# Patient Record
Sex: Male | Born: 1985 | Race: White | Hispanic: No | Marital: Married | Smoking: Never smoker
Health system: Southern US, Community
[De-identification: ages and names within clinical notes are randomized; demographics above are authoritative.]

## PROBLEM LIST (undated history)

## (undated) DIAGNOSIS — G43909 Migraine, unspecified, not intractable, without status migrainosus: Secondary | ICD-10-CM

## (undated) HISTORY — DX: Migraine, unspecified, not intractable, without status migrainosus: G43.909

---

## 2014-01-12 ENCOUNTER — Other Ambulatory Visit: Payer: Self-pay | Admitting: *Deleted

## 2014-01-12 ENCOUNTER — Ambulatory Visit: Payer: No Typology Code available for payment source

## 2014-01-12 ENCOUNTER — Ambulatory Visit
Admission: RE | Admit: 2014-01-12 | Discharge: 2014-01-12 | Disposition: A | Discharge status: Home / Self Care | Payer: No Typology Code available for payment source | Source: Ambulatory Visit | Attending: Emergency Medicine | Admitting: Emergency Medicine

## 2014-01-12 ENCOUNTER — Ambulatory Visit (INDEPENDENT_AMBULATORY_CARE_PROVIDER_SITE_OTHER): Payer: No Typology Code available for payment source | Admitting: Emergency Medicine

## 2014-01-12 VITALS — BP 119/77 | HR 72 | Temp 97.9°F | Resp 18 | Ht 74.0 in | Wt 179.4 lb

## 2014-01-12 DIAGNOSIS — R1032 Left lower quadrant pain: Secondary | ICD-10-CM

## 2014-01-12 DIAGNOSIS — N1330 Unspecified hydronephrosis: Secondary | ICD-10-CM

## 2014-01-12 DIAGNOSIS — R112 Nausea with vomiting, unspecified: Secondary | ICD-10-CM

## 2014-01-12 DIAGNOSIS — R634 Abnormal weight loss: Secondary | ICD-10-CM

## 2014-01-12 LAB — COMPREHENSIVE METABOLIC PANEL
ALT: 23 U/L (ref 0–53)
AST: 13 U/L (ref 0–37)
Albumin: 4.7 g/dL (ref 3.5–5.2)
Alkaline Phosphatase: 48 U/L (ref 39–117)
BUN: 13 mg/dL (ref 6–23)
CO2: 29 meq/L (ref 19–32)
CREATININE: 0.88 mg/dL (ref 0.50–1.35)
Calcium: 9.7 mg/dL (ref 8.4–10.5)
Chloride: 104 mEq/L (ref 96–112)
Glucose, Bld: 90 mg/dL (ref 70–99)
Potassium: 4.3 mEq/L (ref 3.5–5.3)
Sodium: 141 mEq/L (ref 135–145)
Total Bilirubin: 1 mg/dL (ref 0.2–1.2)
Total Protein: 7.5 g/dL (ref 6.0–8.3)

## 2014-01-12 LAB — POCT CBC
Granulocyte percent: 60.6 %G (ref 37–80)
HEMATOCRIT: 48.9 % (ref 43.5–53.7)
Hemoglobin: 15.5 g/dL (ref 14.1–18.1)
LYMPH, POC: 1.6 (ref 0.6–3.4)
MCH, POC: 29.1 pg (ref 27–31.2)
MCHC: 31.7 g/dL — AB (ref 31.8–35.4)
MCV: 91.7 fL (ref 80–97)
MID (cbc): 0.4 (ref 0–0.9)
MPV: 8.3 fL (ref 0–99.8)
POC GRANULOCYTE: 3.2 (ref 2–6.9)
POC LYMPH %: 31.1 % (ref 10–50)
POC MID %: 8.3 %M (ref 0–12)
Platelet Count, POC: 204 10*3/uL (ref 142–424)
RBC: 5.33 M/uL (ref 4.69–6.13)
RDW, POC: 13.7 %
WBC: 5.3 10*3/uL (ref 4.6–10.2)

## 2014-01-12 LAB — T4, FREE: FREE T4: 1.32 ng/dL (ref 0.80–1.80)

## 2014-01-12 LAB — TSH: TSH: 1.272 u[IU]/mL (ref 0.350–4.500)

## 2014-01-12 MED ORDER — ONDANSETRON 4 MG PO TBDP
4.0000 mg | ORAL_TABLET | Freq: Once | ORAL | Status: AC
  Administered 2014-01-12: 4 mg via ORAL

## 2014-01-12 MED ORDER — ONDANSETRON 8 MG PO TBDP: 8.0000 mg | ORAL_TABLET | Freq: Three times a day (TID) | ORAL | Status: DC | PRN

## 2014-01-12 MED ORDER — IOHEXOL 300 MG/ML  SOLN
100.0000 mL | Freq: Once | INTRAMUSCULAR | Status: AC | PRN
  Administered 2014-01-12: 100 mL via INTRAVENOUS

## 2014-01-12 NOTE — Progress Notes (Signed)
   Subjective:    Patient ID: Randy Gibbs, male    DOB: June 03, 1986, 28 y.o.   MRN: [id number]  HPI patient states over the past few months he has had significant weight loss. He thought this was secondary to [facility] but now he's not sure. He became ill on Sunday and an episode of vomiting. Off-and-on since then he has had some loose stools. He has been having discomfort in his left lower abdomen. He states initially he had some fever but this is now resolved. He denies vomiting up any blood or having any blood or mucus in the loose stools. He has not been on antibiotics recently he has had no recent foreign travel his wife is also ill and initially.    Review of Systems     Objective:   Physical Exam patient is alert and cooperative he is not icteric. His neck is supple. His chest was clear to both auscultation and percussion. His heart is regular rate without murmurs rubs or gallops. His abdomen is flat there are bowel sounds present. He has significant discomfort in the left lower quadrant without rebound. Extremities without cyanosis clubbing or edema. Results for orders placed in visit on 01/12/14  POCT CBC      Result Value Ref Range   WBC 5.3  4.6 - 10.2 K/uL   Lymph, poc 1.6  0.6 - 3.4   POC LYMPH PERCENT 31.1  10 - 50 %L   MID (cbc) 0.4  0 - 0.9   POC MID % 8.3  0 - 12 %M   POC Granulocyte 3.2  2 - 6.9   Granulocyte percent 60.6  37 - 80 %G   RBC 5.33  4.69 - 6.13 M/uL   Hemoglobin 15.5  14.1 - 18.1 g/dL   HCT, POC 40.948.9  81.143.5 - 53.7 %   MCV 91.7  80 - 97 fL   MCH, POC 29.1  27 - 31.2 pg   MCHC 31.7 (*) 31.8 - 35.4 g/dL   RDW, POC 91.413.7     Platelet Count, POC 204  142 - 424 K/uL   MPV 8.3  0 - 99.8 fL   [facility] reading (PRIMARY) by  Dr. Cleta Albertsaub there is stool  present in the right side of the colon. There is no free air, no obstruction.         Assessment & Plan:  Patient presents with a history of fever vomiting and diarrhea and significant left lower  quadrant abdominal discomfort on exam. This could be a colitis versus diverticulitis. We'll screen with a CBC, and  acute abdominal series. White count would say that this is a viral illness. We'll give him some Zofran and Gatorade and proceed with outpatient CT.

## 2014-01-12 NOTE — Patient Instructions (Signed)

## 2014-01-14 ENCOUNTER — Other Ambulatory Visit: Payer: Self-pay | Admitting: Radiology

## 2014-01-14 DIAGNOSIS — N1330 Unspecified hydronephrosis: Secondary | ICD-10-CM

## 2014-01-20 ENCOUNTER — Other Ambulatory Visit (HOSPITAL_COMMUNITY): Payer: Self-pay | Admitting: Urology

## 2014-01-20 DIAGNOSIS — N1330 Unspecified hydronephrosis: Secondary | ICD-10-CM

## 2014-01-28 ENCOUNTER — Ambulatory Visit (HOSPITAL_COMMUNITY): Admission: RE | Admit: 2014-01-28 | Payer: No Typology Code available for payment source | Source: Ambulatory Visit

## 2014-03-28 ENCOUNTER — Ambulatory Visit (INDEPENDENT_AMBULATORY_CARE_PROVIDER_SITE_OTHER): Payer: No Typology Code available for payment source | Admitting: Family Medicine

## 2014-03-28 VITALS — BP 102/74 | HR 91 | Temp 98.4°F | Resp 18 | Ht 74.5 in | Wt 174.0 lb

## 2014-03-28 DIAGNOSIS — G43909 Migraine, unspecified, not intractable, without status migrainosus: Secondary | ICD-10-CM

## 2014-03-28 DIAGNOSIS — R110 Nausea: Secondary | ICD-10-CM

## 2014-03-28 MED ORDER — ONDANSETRON 8 MG PO TBDP: 8.0000 mg | ORAL_TABLET | Freq: Three times a day (TID) | ORAL | Status: DC | PRN

## 2014-03-28 NOTE — Progress Notes (Signed)
 Chief Complaint:  Chief Complaint  Patient presents with  . Migraine    woke with this morning unable to work needs note     HPI: Randy GardenerJames Broner is a 28 y.o. male who is here for  Note for work since ut with migrine. Typical migraine, is getting better. Takes imitrex, 9 pills of 100 mg of imitrex breaks it in half and lasts for 1 month, he is getting better on imitrex.  He does not need anything extra. He has had some nause, it is not he worse HA of his life.  Has tried several different things in the past No known triggers, sees HA specialist, no one in family has HA/migraines.  He has tried botox, other triptanbs, topimax  Past Medical History  Diagnosis Date  . Migraines    History reviewed. No pertinent past surgical history. History   Social History  . Marital Status: Married    Spouse Name: N/A    Number of Children: N/A  . Years of Education: N/A   Social History Main Topics  . Smoking status: Never Smoker   . Smokeless tobacco: None  . Alcohol Use: No  . Drug Use: No  . Sexual Activity: None   Other Topics Concern  . None   Social History Narrative  . None   History reviewed. No pertinent family history. No Known Allergies Prior to Admission medications   Medication Sig Start Date End Date Taking? Authorizing Provider  SUMAtriptan (IMITREX) 100 MG tablet Take 100 mg by mouth every 2 (two) hours as needed for migraine or headache. May repeat in 2 hours if headache persists or recurs.   Yes Historical Provider, MD  ondansetron (ZOFRAN-ODT) 8 MG disintegrating tablet Take 1 tablet (8 mg total) by mouth every 8 (eight) hours as needed for nausea. 01/12/14   Collene GobbleSteven A Daub, MD     ROS: The patient denies fevers, chills, night sweats, unintentional weight loss, chest pain, palpitations, wheezing, dyspnea on exertion,  vomiting, abdominal pain, dysuria, hematuria, melena, numbness, weakness, or tingling. NO vision changes, eye pain.   All other systems have  been reviewed and were otherwise negative with the exception of those mentioned in the HPI and as above.    PHYSICAL EXAM: Filed Vitals:   03/28/14 1353  BP: 102/74  Pulse: 91  Temp: 98.4 F (36.9 C)  Resp: 18   Filed Vitals:   03/28/14 1353  Height: 6' 2.5" (1.892 m)  Weight: 174 lb (78.926 kg)   Body mass index is 22.05 kg/(m^2).  General: Alert, no acute distress HEENT:  Normocephalic, atraumatic, oropharynx patent. EOMI, PERRLA Cardiovascular:  Regular rate and rhythm, no rubs murmurs or gallops.  No Carotid bruits, radial pulse intact. No pedal edema.  Respiratory: Clear to auscultation bilaterally.  No wheezes, rales, or rhonchi.  No cyanosis, no use of accessory musculature GI: No organomegaly, abdomen is soft and non-tender, positive bowel sounds.  No masses. Skin: No rashes. Neurologic: Facial musculature symmetric. Psychiatric: Patient is appropriate throughout our interaction. Lymphatic: No cervical lymphadenopathy Musculoskeletal: Gait intact.   LABS: Results for orders placed in visit on 01/12/14  TSH      Result Value Ref Range   TSH 1.272  0.350 - 4.500 uIU/mL  COMPREHENSIVE METABOLIC PANEL      Result Value Ref Range   Sodium 141  135 - 145 mEq/L   Potassium 4.3  3.5 - 5.3 mEq/L   Chloride 104  96 - 112 mEq/L  CO2 29  19 - 32 mEq/L   Glucose, Bld 90  70 - 99 mg/dL   BUN 13  6 - 23 mg/dL   Creat 1.610.88  0.960.50 - 0.451.35 mg/dL   Total Bilirubin 1.0  0.2 - 1.2 mg/dL   Alkaline Phosphatase 48  39 - 117 U/L   AST 13  0 - 37 U/L   ALT 23  0 - 53 U/L   Total Protein 7.5  6.0 - 8.3 g/dL   Albumin 4.7  3.5 - 5.2 g/dL   Calcium 9.7  8.4 - 40.910.5 mg/dL  T4, FREE      Result Value Ref Range   Free T4 1.32  0.80 - 1.80 ng/dL  POCT CBC      Result Value Ref Range   WBC 5.3  4.6 - 10.2 K/uL   Lymph, poc 1.6  0.6 - 3.4   POC LYMPH PERCENT 31.1  10 - 50 %L   MID (cbc) 0.4  0 - 0.9   POC MID % 8.3  0 - 12 %M   POC Granulocyte 3.2  2 - 6.9   Granulocyte percent 60.6   37 - 80 %G   RBC 5.33  4.69 - 6.13 M/uL   Hemoglobin 15.5  14.1 - 18.1 g/dL   HCT, POC 81.148.9  91.443.5 - 53.7 %   MCV 91.7  80 - 97 fL   MCH, POC 29.1  27 - 31.2 pg   MCHC 31.7 (*) 31.8 - 35.4 g/dL   RDW, POC 78.213.7     Platelet Count, POC 204  142 - 424 K/uL   MPV 8.3  0 - 99.8 fL     EKG/XRAY:   [facility]   ASSESSMENT/PLAN: Encounter Diagnoses  Name Primary?  . Nausea alone Yes  . Migraine    Refileld zofran prn Work note given F/u prn  Gross sideeffects, risk and benefits, and alternatives of medications d/w patient. Patient is aware that all medications have potential sideeffects and we are unable to predict every sideeffect or drug-drug interaction that may occur.  nell Antuhao P , DO 03/28/2014 2:25 PM

## 2014-06-10 ENCOUNTER — Ambulatory Visit (INDEPENDENT_AMBULATORY_CARE_PROVIDER_SITE_OTHER): Payer: No Typology Code available for payment source | Admitting: Family Medicine

## 2014-06-10 ENCOUNTER — Encounter: Payer: Self-pay | Admitting: Family Medicine

## 2014-06-10 VITALS — BP 108/75 | HR 87 | Temp 98.3°F | Resp 16 | Ht 74.25 in | Wt 173.8 lb

## 2014-06-10 DIAGNOSIS — K625 Hemorrhage of anus and rectum: Secondary | ICD-10-CM

## 2014-06-10 DIAGNOSIS — Z0000 Encounter for general adult medical examination without abnormal findings: Secondary | ICD-10-CM

## 2014-06-10 DIAGNOSIS — K5900 Constipation, unspecified: Secondary | ICD-10-CM

## 2014-06-10 LAB — COMPLETE METABOLIC PANEL WITH GFR
AST: 12 U/L (ref 0–37)
Albumin: 4.8 g/dL (ref 3.5–5.2)
BUN: 17 mg/dL (ref 6–23)
CO2: 24 mEq/L (ref 19–32)
Calcium: 9.8 mg/dL (ref 8.4–10.5)
Chloride: 104 mEq/L (ref 96–112)
Creat: 0.87 mg/dL (ref 0.50–1.35)
GFR, Est African American: >89 mL/min
GFR, Est Non African American: >89 mL/min
Glucose, Bld: 90 mg/dL (ref 70–99)
Potassium: 4 mEq/L (ref 3.5–5.3)
Total Bilirubin: 0.9 mg/dL (ref 0.2–1.2)

## 2014-06-10 LAB — LIPID PANEL
Cholesterol: 122 mg/dL (ref 0–200)
HDL: 26 mg/dL — ABNORMAL LOW (ref >39)
LDL Cholesterol: 74 mg/dL (ref 0–99)
Total CHOL/HDL Ratio: 4.7 Ratio
Triglycerides: 112 mg/dL (ref <150)
VLDL: 22 mg/dL (ref 0–40)

## 2014-06-10 LAB — CBC WITH DIFFERENTIAL/PLATELET
Basophils Absolute: 0.1 10*3/uL (ref 0.0–0.1)
Basophils Relative: 1 % (ref 0–1)
Eosinophils Absolute: 0.8 10*3/uL — ABNORMAL HIGH (ref 0.0–0.7)
Eosinophils Relative: 11 % — ABNORMAL HIGH (ref 0–5)
HCT: 44.7 % (ref 39.0–52.0)
Hemoglobin: 16 g/dL (ref 13.0–17.0)
Lymphocytes Relative: 29 % (ref 12–46)
Lymphs Abs: 2.1 10*3/uL (ref 0.7–4.0)
MCH: 29 pg (ref 26.0–34.0)
MCHC: 35.8 g/dL (ref 30.0–36.0)
MCV: 81.1 fL (ref 78.0–100.0)
Monocytes Absolute: 0.7 10*3/uL (ref 0.1–1.0)
Monocytes Relative: 10 % (ref 3–12)
Neutro Abs: 3.6 10*3/uL (ref 1.7–7.7)
Neutrophils Relative %: 49 % (ref 43–77)
Platelets: 216 10*3/uL (ref 150–400)
RBC: 5.51 MIL/uL (ref 4.22–5.81)
RDW: 13.2 % (ref 11.5–15.5)
WBC: 7.3 10*3/uL (ref 4.0–10.5)

## 2014-06-10 LAB — COMPLETE METABOLIC PANEL WITHOUT GFR
ALT: 28 U/L (ref 0–53)
Alkaline Phosphatase: 52 U/L (ref 39–117)
Sodium: 139 meq/L (ref 135–145)
Total Protein: 7.6 g/dL (ref 6.0–8.3)

## 2014-06-10 LAB — TSH: TSH: 2.527 u[IU]/mL (ref 0.350–4.500)

## 2014-06-10 NOTE — Patient Instructions (Signed)

## 2014-06-10 NOTE — Progress Notes (Signed)
Chief Complaint:  Chief Complaint  Patient presents with  . Annual Exam  . Rectal Bleeding    noticing blood on toilet paper when he wipes.    HPI: Randy GardenerJames Bruschi is a 28 y.o. male who is here for CPE. HE has not complaints. He has had bright red blood on toilet paper only when he wipes after BM for last 1 week, has h/o straining, deneis hemorrhoids or rectal fissures or itching.  No family h/o colon issues, ie cancer UC/crohns, Denies diarrhea, abd , n/v. He did have weightloss with a prior job due to stress. No fevers, chills, night sweats. He did have some weightloss due to poor work environment but weight has stabilized since he quit his job.   Past Medical History  Diagnosis Date  . Migraines    History reviewed. No pertinent past surgical history. History   Social History  . Marital Status: Married    Spouse Name: N/A    Number of Children: N/A  . Years of Education: N/A   Social History Main Topics  . Smoking status: Never Smoker   . Smokeless tobacco: None  . Alcohol Use: No  . Drug Use: No  . Sexual Activity: Yes   Other Topics Concern  . None   Social History Narrative  . None   Family History  Problem Relation Age of Onset  . Cancer Mother   . Cancer Maternal Grandfather   . Heart disease Paternal Grandfather    No Known Allergies Prior to Admission medications   Medication Sig Start Date End Date Taking? Authorizing Provider  SUMAtriptan (IMITREX) 100 MG tablet Take 100 mg by mouth every 2 (two) hours as needed for migraine or headache. May repeat in 2 hours if headache persists or recurs.   Yes Historical Provider, MD  ondansetron (ZOFRAN-ODT) 8 MG disintegrating tablet Take 1 tablet (8 mg total) by mouth every 8 (eight) hours as needed for nausea. 03/28/14   Ennis Heavner P Manila Rommel, DO     ROS: The patient denies fevers, chills, night sweats, chest pain, palpitations, wheezing, dyspnea on exertion, nausea, vomiting, abdominal pain, dysuria, hematuria,  melena, numbness, weakness, or tingling.  All other systems have been reviewed and were otherwise negative with the exception of those mentioned in the HPI and as above.    PHYSICAL EXAM: Filed Vitals:   06/10/14 0832  BP: 108/75  Pulse: 87  Temp: 98.3 F (36.8 C)  Resp: 16   Filed Vitals:   06/10/14 0832  Height: 6' 2.25" (1.886 m)  Weight: 173 lb 12.8 oz (78.835 kg)   Body mass index is 22.16 kg/(m^2).  General: Alert, no acute distress HEENT:  Normocephalic, atraumatic, oropharynx patent. EOMI, PERRLA, fundo exam nl Cardiovascular:  Regular rate and rhythm, no rubs murmurs or gallops.  No Carotid bruits, radial pulse intact. No pedal edema.  Respiratory: Clear to auscultation bilaterally.  No wheezes, rales, or rhonchi.  No cyanosis, no use of accessory musculature GI: No organomegaly, abdomen is soft and non-tender, positive bowel sounds.  No masses. Skin: No rashes. Neurologic: Facial musculature symmetric. Psychiatric: Patient is appropriate throughout our interaction. Lymphatic: No cervical lymphadenopathy Musculoskeletal: Gait intact. 5/5 str, 2/2 DTRs Gu-circumcised male, no masses, lesions, neg hernia Neg hemorrhoids, +/- rectal fissure Rectal exam showed no masses, prostate is normal size  LABS: Results for orders placed in visit on 01/12/14  TSH      Result Value Ref Range   TSH 1.272  0.350 -  4.500 uIU/mL  COMPREHENSIVE METABOLIC PANEL      Result Value Ref Range   Sodium 141  135 - 145 mEq/L   Potassium 4.3  3.5 - 5.3 mEq/L   Chloride 104  96 - 112 mEq/L   CO2 29  19 - 32 mEq/L   Glucose, Bld 90  70 - 99 mg/dL   BUN 13  6 - 23 mg/dL   Creat 7.82  9.56 - 2.13 mg/dL   Total Bilirubin 1.0  0.2 - 1.2 mg/dL   Alkaline Phosphatase 48  39 - 117 U/L   AST 13  0 - 37 U/L   ALT 23  0 - 53 U/L   Total Protein 7.5  6.0 - 8.3 g/dL   Albumin 4.7  3.5 - 5.2 g/dL   Calcium 9.7  8.4 - 08.6 mg/dL  T4, FREE      Result Value Ref Range   Free T4 1.32  0.80 - 1.80  ng/dL  POCT CBC      Result Value Ref Range   WBC 5.3  4.6 - 10.2 K/uL   Lymph, poc 1.6  0.6 - 3.4   POC LYMPH PERCENT 31.1  10 - 50 %L   MID (cbc) 0.4  0 - 0.9   POC MID % 8.3  0 - 12 %M   POC Granulocyte 3.2  2 - 6.9   Granulocyte percent 60.6  37 - 80 %G   RBC 5.33  4.69 - 6.13 M/uL   Hemoglobin 15.5  14.1 - 18.1 g/dL   HCT, POC 57.8  46.9 - 53.7 %   MCV 91.7  80 - 97 fL   MCH, POC 29.1  27 - 31.2 pg   MCHC 31.7 (*) 31.8 - 35.4 g/dL   RDW, POC 62.9     Platelet Count, POC 204  142 - 424 K/uL   MPV 8.3  0 - 99.8 fL     EKG/XRAY:   Primary read interpreted by Dr. Conley Rolls at [facility].   ASSESSMENT/PLAN: Encounter Diagnoses  Name Primary?  . Annual physical exam Yes  . BRBPR (bright red blood per rectum)   . Unspecified constipation    otc miralax Annual labs If retcal bleeding cont after treatment for possible constipation/straining then will refer to GI--Dr Leone Payor F/u prn  Gross sideeffects, risk and benefits, and alternatives of medications d/w patient. Patient is aware that all medications have potential sideeffects and we are unable to predict every sideeffect or drug-drug interaction that may occur.  Hamilton Capri PHUONG, DO 06/10/2014 12:29 PM

## 2014-06-11 ENCOUNTER — Encounter: Payer: Self-pay | Admitting: Family Medicine

## 2014-09-04 IMAGING — CT CT ABD-PELV W/ CM
2 of 4 series · 17 of 46 positions shown, 19 images · IV contrast ([ID] [id number])
Comparison: None.

CLINICAL DATA: Left lower quadrant tenderness, fever, possible
colitis. History of renal stones.

EXAM:
CT ABDOMEN AND PELVIS WITH CONTRAST
TECHNIQUE: Multidetector CT imaging of the abdomen and pelvis was performed
using the standard protocol following bolus administration of
intravenous contrast.
CONTRAST:  100mL OMNIPAQUE IOHEXOL 300 MG/ML  SOLN

[Series 2: abd/pelvis with · axial · 0.70mm/px · z∈[-469,-79]mm · 14 of 86 slices shown, 16 images]
[im 4/86  soft-tissue]
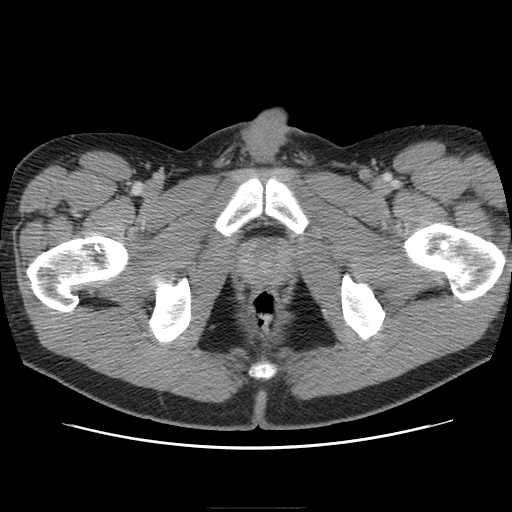
[im 4/86  bone]
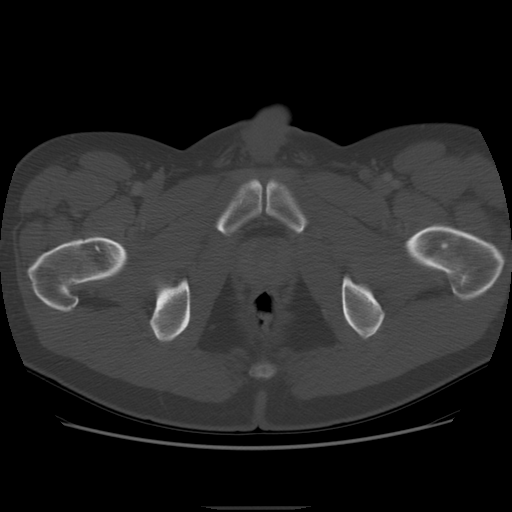
[im 11/86  soft-tissue]
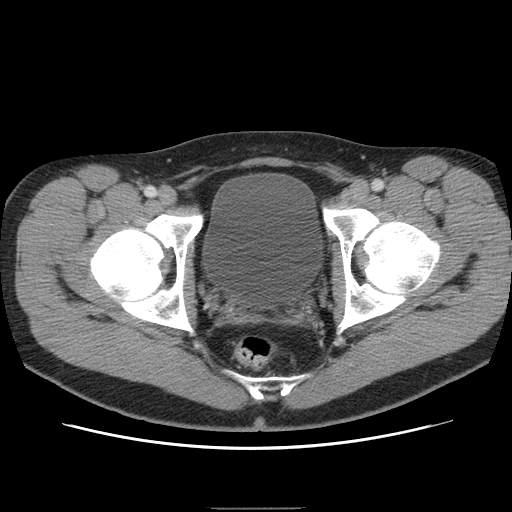
[im 18/86  soft-tissue]
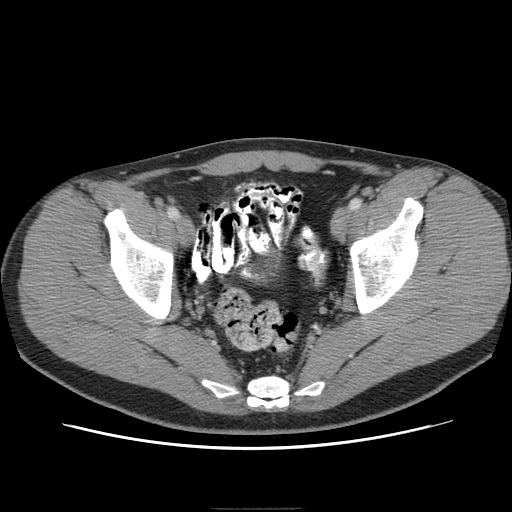
[im 22/86  soft-tissue]
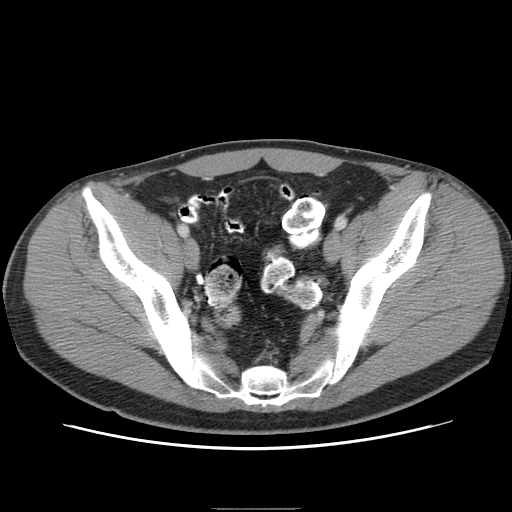
[im 29/86  soft-tissue]
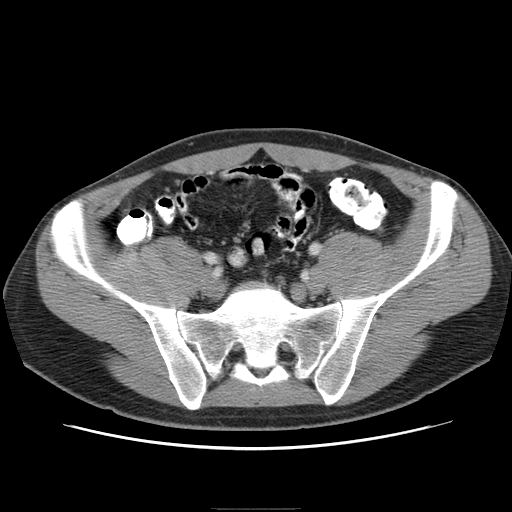
[im 36/86  soft-tissue]
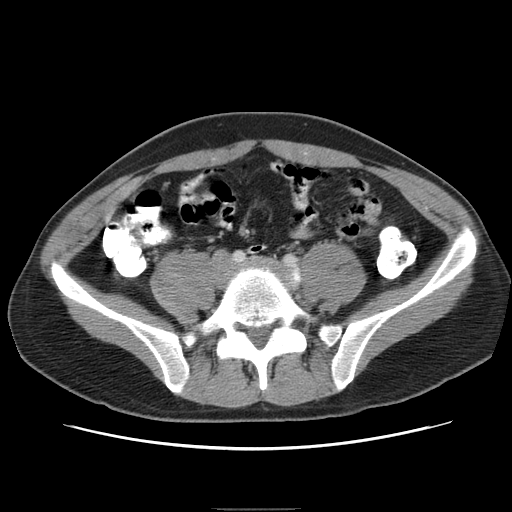
[im 39/86  soft-tissue]
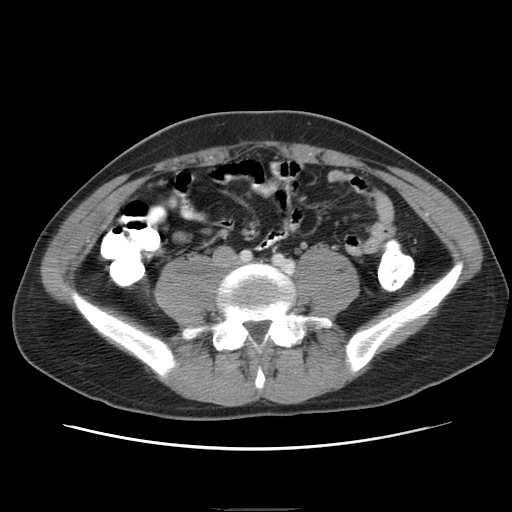
[im 47/86  soft-tissue]
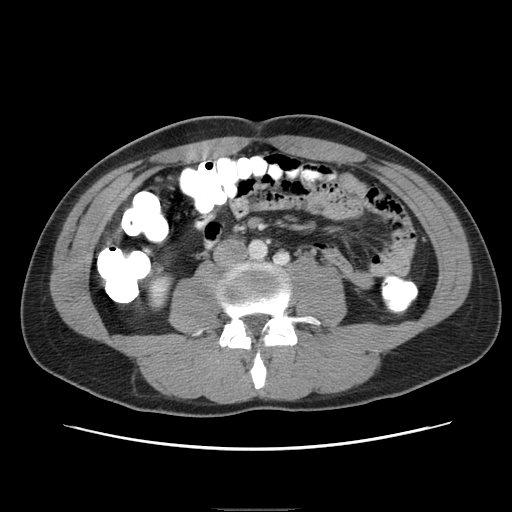
[im 50/86  soft-tissue]
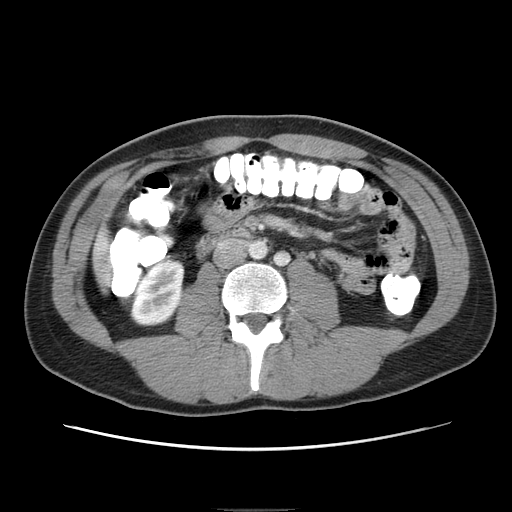
[im 50/86  bone]
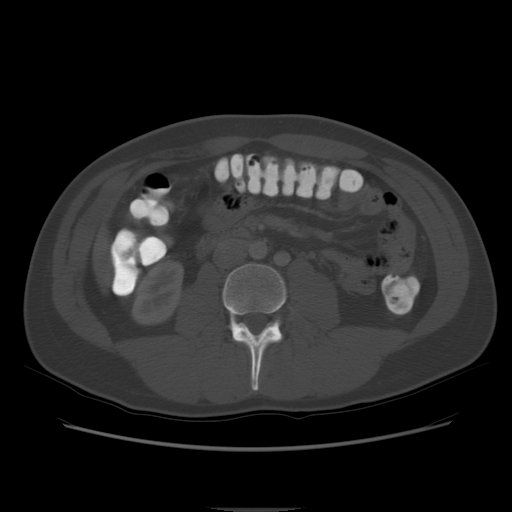
[im 57/86  soft-tissue]
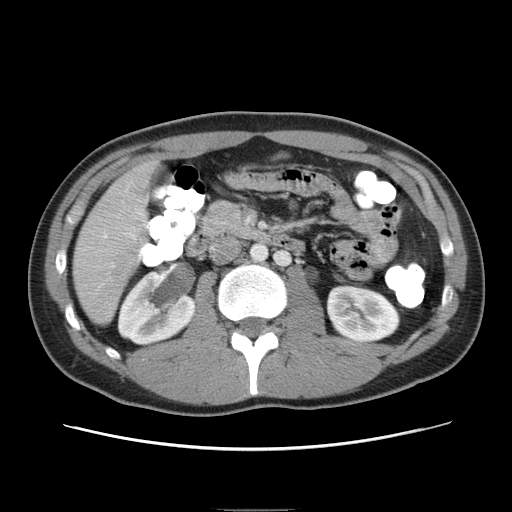
[im 64/86  soft-tissue]
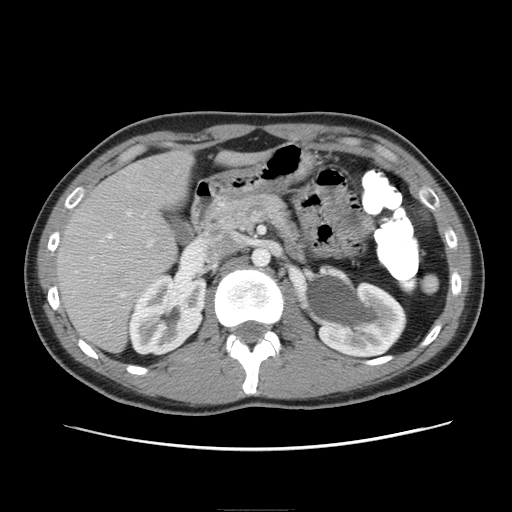
[im 68/86  soft-tissue]
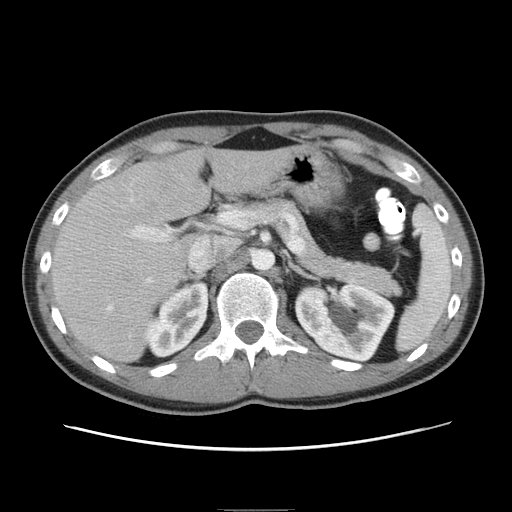
[im 75/86  soft-tissue]
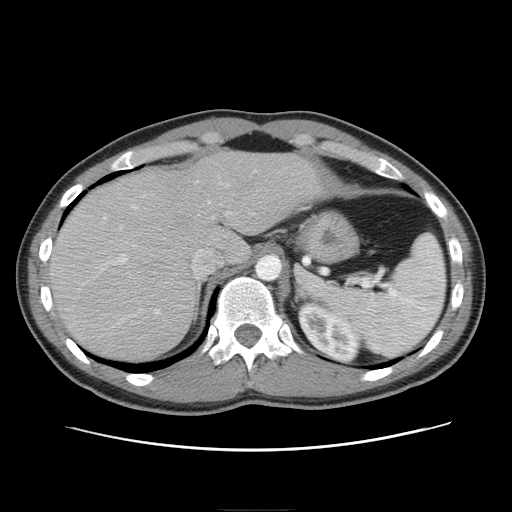
[im 82/86  soft-tissue]
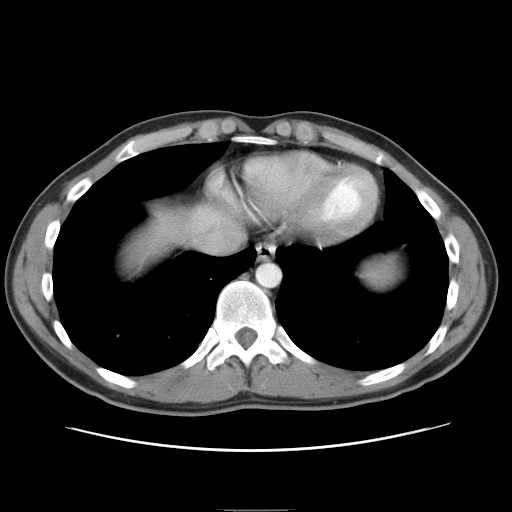

[Series 400: cor · coronal · 0.93mm/px · 3 of 134 slices shown]
[im 45/134  soft-tissue]
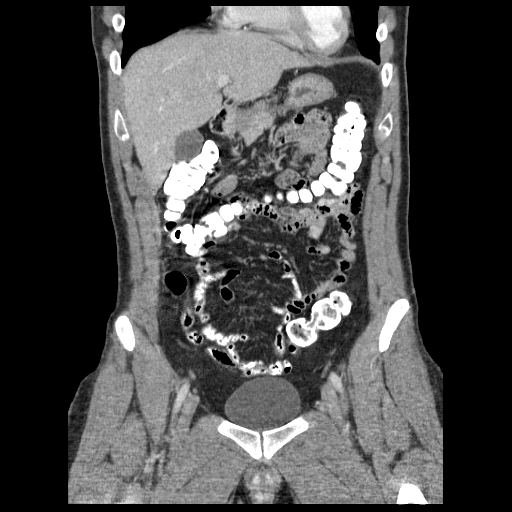
[im 60/134  soft-tissue]
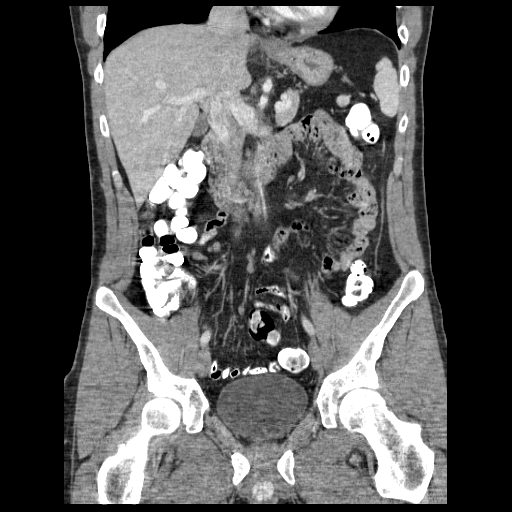
[im 74/134  soft-tissue]
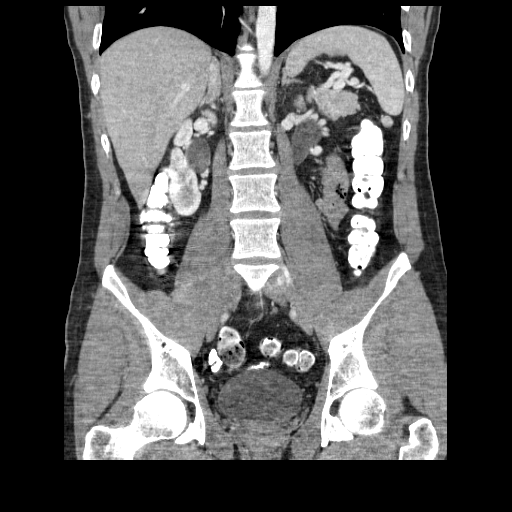

[17 of 46 positions shown; findings below may reference images not displayed]

FINDINGS: Lung bases are clear.

Liver, spleen, pancreas, and adrenal glands are within normal
limits.

Gallbladder is unremarkable. No intrahepatic or extrahepatic ductal
dilatation.

Kidneys are within normal limits. Right extrarenal pelvis. Mild to
moderate left hydronephrosis with abrupt narrowing at the UPJ,
favored to reflect UPJ stenosis.

No evidence of bowel obstruction. Normal appendix. No colonic wall
thickening or inflammatory changes.

No evidence of abdominal aortic aneurysm.

No abdominopelvic ascites.

No suspicious abdominopelvic lymphadenopathy.

Prostate is unremarkable.

Bladder is within normal limits.

Visualized osseous structures are within normal limits.
IMPRESSION: Mild to moderate left hydronephrosis, likely on the basis of UPJ
stenosis.

No evidence of colitis.
